# Patient Record
Sex: Male | Born: 1945
Health system: Southern US, Community
[De-identification: ages and names within clinical notes are randomized; demographics above are authoritative.]

---

## 2000-10-08 ENCOUNTER — Emergency Department (HOSPITAL_COMMUNITY): Admission: EM | Admit: 2000-10-08 | Discharge: 2000-10-08 | Payer: Self-pay | Admitting: Emergency Medicine

## 2000-10-08 ENCOUNTER — Encounter: Payer: Self-pay | Admitting: Emergency Medicine

## 2006-09-19 ENCOUNTER — Ambulatory Visit (HOSPITAL_COMMUNITY): Admission: RE | Admit: 2006-09-19 | Discharge: 2006-09-19 | Payer: Self-pay | Admitting: Cardiology

## 2015-12-02 DIAGNOSIS — S60221A Contusion of right hand, initial encounter: Secondary | ICD-10-CM | POA: Diagnosis not present

## 2015-12-02 DIAGNOSIS — S6991XA Unspecified injury of right wrist, hand and finger(s), initial encounter: Secondary | ICD-10-CM | POA: Diagnosis not present

## 2015-12-02 DIAGNOSIS — M7989 Other specified soft tissue disorders: Secondary | ICD-10-CM | POA: Diagnosis not present

## 2015-12-02 DIAGNOSIS — M791 Myalgia: Secondary | ICD-10-CM | POA: Diagnosis not present

## 2016-05-09 DIAGNOSIS — Z23 Encounter for immunization: Secondary | ICD-10-CM | POA: Diagnosis not present

## 2016-08-28 DIAGNOSIS — Z23 Encounter for immunization: Secondary | ICD-10-CM | POA: Diagnosis not present

## 2017-04-22 DIAGNOSIS — Z23 Encounter for immunization: Secondary | ICD-10-CM | POA: Diagnosis not present

## 2017-08-29 DIAGNOSIS — Z23 Encounter for immunization: Secondary | ICD-10-CM | POA: Diagnosis not present

## 2017-11-05 DIAGNOSIS — L980 Pyogenic granuloma: Secondary | ICD-10-CM | POA: Diagnosis not present

## 2018-02-16 DIAGNOSIS — D225 Melanocytic nevi of trunk: Secondary | ICD-10-CM | POA: Diagnosis not present

## 2018-02-16 DIAGNOSIS — L821 Other seborrheic keratosis: Secondary | ICD-10-CM | POA: Diagnosis not present

## 2018-02-16 DIAGNOSIS — L570 Actinic keratosis: Secondary | ICD-10-CM | POA: Diagnosis not present

## 2018-02-16 DIAGNOSIS — D1801 Hemangioma of skin and subcutaneous tissue: Secondary | ICD-10-CM | POA: Diagnosis not present

## 2018-02-16 DIAGNOSIS — L281 Prurigo nodularis: Secondary | ICD-10-CM | POA: Diagnosis not present

## 2018-03-18 DIAGNOSIS — D492 Neoplasm of unspecified behavior of bone, soft tissue, and skin: Secondary | ICD-10-CM | POA: Diagnosis not present

## 2018-03-27 DIAGNOSIS — L929 Granulomatous disorder of the skin and subcutaneous tissue, unspecified: Secondary | ICD-10-CM | POA: Diagnosis not present

## 2018-04-01 DIAGNOSIS — L980 Pyogenic granuloma: Secondary | ICD-10-CM | POA: Diagnosis not present

## 2018-04-01 DIAGNOSIS — L928 Other granulomatous disorders of the skin and subcutaneous tissue: Secondary | ICD-10-CM | POA: Diagnosis not present

## 2018-04-01 DIAGNOSIS — D2372 Other benign neoplasm of skin of left lower limb, including hip: Secondary | ICD-10-CM | POA: Diagnosis not present

## 2018-04-01 DIAGNOSIS — L929 Granulomatous disorder of the skin and subcutaneous tissue, unspecified: Secondary | ICD-10-CM | POA: Diagnosis not present

## 2018-04-15 DIAGNOSIS — S91102D Unspecified open wound of left great toe without damage to nail, subsequent encounter: Secondary | ICD-10-CM | POA: Diagnosis not present

## 2018-05-06 DIAGNOSIS — Z23 Encounter for immunization: Secondary | ICD-10-CM | POA: Diagnosis not present

## 2018-05-06 DIAGNOSIS — L929 Granulomatous disorder of the skin and subcutaneous tissue, unspecified: Secondary | ICD-10-CM | POA: Diagnosis not present

## 2018-07-16 DIAGNOSIS — J9588 Other intraoperative complications of respiratory system, not elsewhere classified: Secondary | ICD-10-CM | POA: Diagnosis not present

## 2018-07-16 DIAGNOSIS — R042 Hemoptysis: Secondary | ICD-10-CM | POA: Diagnosis not present

## 2018-07-16 DIAGNOSIS — I10 Essential (primary) hypertension: Secondary | ICD-10-CM | POA: Diagnosis not present

## 2018-07-16 DIAGNOSIS — K219 Gastro-esophageal reflux disease without esophagitis: Secondary | ICD-10-CM | POA: Diagnosis not present

## 2018-07-16 DIAGNOSIS — R079 Chest pain, unspecified: Secondary | ICD-10-CM | POA: Diagnosis not present

## 2018-07-16 DIAGNOSIS — K7689 Other specified diseases of liver: Secondary | ICD-10-CM | POA: Diagnosis not present

## 2018-07-16 DIAGNOSIS — Z79899 Other long term (current) drug therapy: Secondary | ICD-10-CM | POA: Diagnosis not present

## 2018-07-16 DIAGNOSIS — R918 Other nonspecific abnormal finding of lung field: Secondary | ICD-10-CM | POA: Diagnosis not present

## 2018-07-17 DIAGNOSIS — R042 Hemoptysis: Secondary | ICD-10-CM | POA: Diagnosis not present

## 2019-05-26 DIAGNOSIS — Z23 Encounter for immunization: Secondary | ICD-10-CM | POA: Diagnosis not present

## 2019-11-19 ENCOUNTER — Ambulatory Visit: Payer: Medicare Other | Attending: Internal Medicine

## 2019-11-19 DIAGNOSIS — Z23 Encounter for immunization: Secondary | ICD-10-CM

## 2019-11-19 NOTE — Progress Notes (Signed)
   Covid-19 Vaccination Clinic  Name:  Jeffery Duran    MRN: [id number] DOB: 1945-12-05  11/19/2019  [name] was observed post Covid-19 immunization for 15 minutes without incident. He was provided with Vaccine Information Sheet and instruction to access the V-Safe system.   [name] was instructed to call 911 with any severe reactions post vaccine: Marland Kitchen Difficulty breathing  . Swelling of face and throat  . A fast heartbeat  . A bad rash all over body  . Dizziness and weakness   Immunizations Administered    Name Date Dose VIS Date Route   Pfizer COVID-19 Vaccine 11/19/2019  2:43 PM 0.3 mL 08/20/2019 Intramuscular   Manufacturer: [place]   Lot: [id number]   [place]: [id number]

## 2019-12-14 ENCOUNTER — Ambulatory Visit: Payer: Medicare Other | Attending: Internal Medicine

## 2019-12-14 DIAGNOSIS — Z23 Encounter for immunization: Secondary | ICD-10-CM

## 2019-12-14 NOTE — Progress Notes (Signed)
   Covid-19 Vaccination Clinic  Name:  Jeffery Duran    MRN: [id number] DOB: [birth year]  12/14/2019  Jeffery Duran was observed post Covid-19 immunization for 15 minutes without incident. He was provided with Vaccine Information Sheet and instruction to access the V-Safe system.   Jeffery Duran was instructed to call 911 with any severe reactions post vaccine: Marland Kitchen Difficulty breathing  . Swelling of face and throat  . A fast heartbeat  . A bad rash all over body  . Dizziness and weakness   Immunizations Administered    Name Date Dose VIS Date Route   Pfizer COVID-19 Vaccine 12/14/2019  1:45 PM 0.3 mL 08/20/2019 Intramuscular   Manufacturer: [place]   Lot: [id number]   [place]: [id number]

## 2020-06-10 ENCOUNTER — Encounter: Payer: Self-pay | Admitting: Emergency Medicine

## 2020-06-10 ENCOUNTER — Emergency Department (INDEPENDENT_AMBULATORY_CARE_PROVIDER_SITE_OTHER): Payer: Medicare Other

## 2020-06-10 ENCOUNTER — Other Ambulatory Visit: Payer: Self-pay

## 2020-06-10 ENCOUNTER — Emergency Department (INDEPENDENT_AMBULATORY_CARE_PROVIDER_SITE_OTHER)
Admission: EM | Admit: 2020-06-10 | Discharge: 2020-06-10 | Disposition: A | Discharge status: Home / Self Care | Payer: Medicare Other | Source: Home / Self Care

## 2020-06-10 DIAGNOSIS — Z6829 Body mass index (BMI) 29.0-29.9, adult: Secondary | ICD-10-CM | POA: Diagnosis not present

## 2020-06-10 DIAGNOSIS — E871 Hypo-osmolality and hyponatremia: Secondary | ICD-10-CM | POA: Diagnosis present

## 2020-06-10 DIAGNOSIS — I519 Heart disease, unspecified: Secondary | ICD-10-CM | POA: Diagnosis not present

## 2020-06-10 DIAGNOSIS — R5383 Other fatigue: Secondary | ICD-10-CM | POA: Diagnosis not present

## 2020-06-10 DIAGNOSIS — R0902 Hypoxemia: Secondary | ICD-10-CM | POA: Diagnosis not present

## 2020-06-10 DIAGNOSIS — R590 Localized enlarged lymph nodes: Secondary | ICD-10-CM | POA: Diagnosis not present

## 2020-06-10 DIAGNOSIS — R918 Other nonspecific abnormal finding of lung field: Secondary | ICD-10-CM | POA: Diagnosis not present

## 2020-06-10 DIAGNOSIS — J9601 Acute respiratory failure with hypoxia: Secondary | ICD-10-CM | POA: Diagnosis not present

## 2020-06-10 DIAGNOSIS — I10 Essential (primary) hypertension: Secondary | ICD-10-CM | POA: Diagnosis present

## 2020-06-10 DIAGNOSIS — J189 Pneumonia, unspecified organism: Secondary | ICD-10-CM

## 2020-06-10 DIAGNOSIS — D72819 Decreased white blood cell count, unspecified: Secondary | ICD-10-CM | POA: Diagnosis not present

## 2020-06-10 DIAGNOSIS — J90 Pleural effusion, not elsewhere classified: Secondary | ICD-10-CM | POA: Diagnosis not present

## 2020-06-10 DIAGNOSIS — K219 Gastro-esophageal reflux disease without esophagitis: Secondary | ICD-10-CM | POA: Diagnosis present

## 2020-06-10 DIAGNOSIS — J168 Pneumonia due to other specified infectious organisms: Secondary | ICD-10-CM | POA: Diagnosis not present

## 2020-06-10 DIAGNOSIS — R9431 Abnormal electrocardiogram [ECG] [EKG]: Secondary | ICD-10-CM | POA: Diagnosis not present

## 2020-06-10 DIAGNOSIS — D649 Anemia, unspecified: Secondary | ICD-10-CM | POA: Diagnosis not present

## 2020-06-10 DIAGNOSIS — R509 Fever, unspecified: Secondary | ICD-10-CM | POA: Diagnosis not present

## 2020-06-10 DIAGNOSIS — C880 Waldenstrom macroglobulinemia: Secondary | ICD-10-CM | POA: Diagnosis not present

## 2020-06-10 DIAGNOSIS — R0600 Dyspnea, unspecified: Secondary | ICD-10-CM | POA: Diagnosis not present

## 2020-06-10 DIAGNOSIS — M6281 Muscle weakness (generalized): Secondary | ICD-10-CM | POA: Diagnosis present

## 2020-06-10 DIAGNOSIS — E860 Dehydration: Secondary | ICD-10-CM | POA: Diagnosis present

## 2020-06-10 DIAGNOSIS — E440 Moderate protein-calorie malnutrition: Secondary | ICD-10-CM | POA: Diagnosis present

## 2020-06-10 DIAGNOSIS — R197 Diarrhea, unspecified: Secondary | ICD-10-CM | POA: Diagnosis not present

## 2020-06-10 DIAGNOSIS — D696 Thrombocytopenia, unspecified: Secondary | ICD-10-CM | POA: Diagnosis not present

## 2020-06-10 DIAGNOSIS — Z79899 Other long term (current) drug therapy: Secondary | ICD-10-CM | POA: Diagnosis not present

## 2020-06-10 DIAGNOSIS — Z20822 Contact with and (suspected) exposure to covid-19: Secondary | ICD-10-CM | POA: Diagnosis not present

## 2020-06-10 DIAGNOSIS — R0602 Shortness of breath: Secondary | ICD-10-CM | POA: Diagnosis not present

## 2020-06-10 DIAGNOSIS — R059 Cough, unspecified: Secondary | ICD-10-CM | POA: Diagnosis not present

## 2020-06-10 DIAGNOSIS — R0682 Tachypnea, not elsewhere classified: Secondary | ICD-10-CM | POA: Diagnosis not present

## 2020-06-10 NOTE — ED Notes (Signed)
Patient is being discharged from the Urgent Care and sent to the Emergency Department via POV . Per Leeroy Cha, PA  patient is in need of higher level of care due to SOB, VS & pneumonia. Patient is aware and verbalizes understanding of plan of care. Pt to go straight to ED- discharged in w/c Vitals:   06/10/20 1423  BP: 139/85  Pulse: (!) 103  Temp: (!) 100.7 F (38.2 C)  SpO2: 91%

## 2020-06-10 NOTE — ED Provider Notes (Signed)
[facility]    CSN: [id number] Arrival date & time: 06/10/20  1410      History   Chief Complaint Chief Complaint  Patient presents with  . Fatigue    HPI Jeffery Duran is a 74 y.o. male.   HPI Jeffery Duran is a 74 y.o. male presenting to UC with c/o 2-3 weeks of gradually worsening fatigue, mild SOB, and "not feeling well."  Mildly productive cough.  He has been fully vaccinated for COVID but also reports getting the booster shot a few days prior to current symptoms.  No known fever or chills but has felt warm at home, temp of 100.7*F in triage. No medication taken PTA. Denies n/v/d.  No known sick contacts.  O2 Sat 91% in triage. No hx of COPD or asthma but reports hx of "a blood disorder," Waldenstrom macroglobulinemia.  He was last treated for this with chemo in 2019.    History reviewed. No pertinent past medical history.  There are no problems to display for this patient.   History reviewed. No pertinent surgical history.     Home Medications    Prior to Admission medications   Medication Sig Start Date End Date Taking? Authorizing Provider  amLODipine (NORVASC) 10 MG tablet Take 10 mg by mouth daily. 03/04/20  Yes [provider]  ferrous sulfate 325 (65 FE) MG tablet Take by mouth.   Yes [provider]  omeprazole (PRILOSEC) 20 MG capsule Take by mouth.   Yes [provider]    Family History No family history on file.  Social History Social History   Tobacco Use  . Smoking status: Never Smoker  . Smokeless tobacco: Never Used  Substance Use Topics  . Alcohol use: Not on file  . Drug use: Not on file     Allergies   Patient has no known allergies.   Review of Systems Review of Systems  Constitutional: Positive for fatigue. Negative for chills and fever.  HENT: Positive for congestion. Negative for ear pain, sore throat, trouble swallowing and voice change.   Respiratory: Positive for cough and shortness  of breath.   Cardiovascular: Negative for chest pain and palpitations.  Gastrointestinal: Negative for abdominal pain, diarrhea, nausea and vomiting.  Musculoskeletal: Negative for arthralgias, back pain and myalgias.  Skin: Negative for rash.  Neurological: Positive for weakness ( generalized). Negative for dizziness, light-headedness and headaches.  All other systems reviewed and are negative.    Physical Exam Triage Vital Signs ED Triage Vitals [06/10/20 1423]  Enc Vitals Group     BP 139/85     Pulse Rate (!) 103     Resp      Temp (!) 100.7 F (38.2 C)     Temp Source Oral     SpO2 91 %     Weight      Height      Head Circumference      Peak Flow      Pain Score 0     Pain Loc      Pain Edu?      Excl. in [place]?    No data found.  Updated Vital Signs BP 139/85 (BP Location: Right Arm)   Pulse (!) 103   Temp (!) 100.7 F (38.2 C) (Oral)   SpO2 91%   Visual Acuity Right Eye Distance:   Left Eye Distance:   Bilateral Distance:    Right Eye Near:   Left Eye Near:    Bilateral  Near:     Physical Exam Vitals and nursing note reviewed.  Constitutional:      Appearance: Normal appearance. He is well-developed.     Comments: Elderly male sitting in wheelchair, appears mildly fatigued but is alert and cooperative during exam. NAD  HENT:     Head: Normocephalic and atraumatic.     Right Ear: Tympanic membrane and ear canal normal.     Left Ear: Tympanic membrane and ear canal normal.     Nose: Nose normal.     Right Sinus: No maxillary sinus tenderness or frontal sinus tenderness.     Left Sinus: No maxillary sinus tenderness or frontal sinus tenderness.     Mouth/Throat:     Lips: Pink.     Mouth: Mucous membranes are moist.     Pharynx: Oropharynx is clear. Uvula midline.  Cardiovascular:     Rate and Rhythm: Normal rate and regular rhythm.  Pulmonary:     Effort: Pulmonary effort is normal. No respiratory distress.     Breath sounds: Normal breath  sounds. No stridor. No wheezing, rhonchi or rales.  Musculoskeletal:        General: Normal range of motion.     Cervical back: Normal range of motion and neck supple. No tenderness.  Lymphadenopathy:     Cervical: No cervical adenopathy.  Skin:    General: Skin is warm and dry.  Neurological:     Mental Status: He is alert and oriented to person, place, and time.  Psychiatric:        Behavior: Behavior normal.      UC Treatments / Results  Labs (all labs ordered are listed, but only abnormal results are displayed) Labs Reviewed - No data to display  EKG Date/Time: 06/10/2020   14:34:19 Ventricular Rate: 97 PR Interval: 160 QRS Duration: 96 QT Interval: 350 QTC Calculation: 444 P-R-T axes: 45  -43   72 Text Interpretation: Normal sinus rhythm, possible Left atrial enlargement. Left axis deviation. Pulmonary disease pattern. Incomplete right bundle branch block. Nonspecific ST and T wave abnormality. Abnormal ECG  No significant change since prior EKG in 2019  Radiology DG Chest 2 View  Result Date: 06/10/2020 CLINICAL DATA:  Shortness of breath, fever and productive cough. The patient has felt ill for 2-3 weeks. EXAM: CHEST - 2 VIEW COMPARISON:  None. FINDINGS: Patchy airspace disease in the lingula and right middle lobe is worse on the right middle lobe. Heart size is normal. No pneumothorax or pleural effusion. IMPRESSION: Findings most consistent with multifocal pneumonia. Electronically Signed   By: Inge Rise M.D.   On: 06/10/2020 14:48    Procedures Procedures (including critical care time)  Medications Ordered in UC Medications - No data to display  Initial Impression / Assessment and Plan / UC Course  I have reviewed the triage vital signs and the nursing notes.  Pertinent labs & imaging results that were available during my care of the patient were reviewed by me and considered in my medical decision making (see chart for details).      Discussed imaging with pt Due to diffuse nature of pneumonia, worsening symptoms, O2 sat of 91% on RA and patient's age, recommend further evaluation and treatment in emergency department this afternoon. Pt declined EMS transport but understanding and agreeable with tx plan to go to the hospital. AVS given  Final Clinical Impressions(s) / UC Diagnoses   Final diagnoses:  Multifocal pneumonia     Discharge Instructions  Due to the severity of your pneumonia and your worsening shortness of breath, it is advised you are evaluated further and treated in the emergency department. IV antibiotics may be indicated and they may need to admit you.  You have declined EMS transport, but please still drive directly to the hospital this afternoon.     ED Prescriptions    None     PDMP not reviewed this encounter.   Noe Gens, [place] 06/10/20 [phone]

## 2020-06-10 NOTE — Discharge Instructions (Signed)
  Due to the severity of your pneumonia and your worsening shortness of breath, it is advised you are evaluated further and treated in the emergency department. IV antibiotics may be indicated and they may need to admit you.  You have declined EMS transport, but please still drive directly to the hospital this afternoon.

## 2020-06-10 NOTE — ED Triage Notes (Signed)
Patient states that he is feeling weak, SOB, fever, productive cough, feeling bad x 2-3 weeks.  Patient is vaccinated.  No OTC meds.

## 2020-06-11 DIAGNOSIS — J189 Pneumonia, unspecified organism: Secondary | ICD-10-CM | POA: Diagnosis not present

## 2020-06-11 DIAGNOSIS — J9601 Acute respiratory failure with hypoxia: Secondary | ICD-10-CM | POA: Diagnosis not present

## 2020-06-11 DIAGNOSIS — I10 Essential (primary) hypertension: Secondary | ICD-10-CM | POA: Diagnosis not present

## 2020-06-12 DIAGNOSIS — J189 Pneumonia, unspecified organism: Secondary | ICD-10-CM | POA: Diagnosis not present

## 2020-06-12 DIAGNOSIS — I10 Essential (primary) hypertension: Secondary | ICD-10-CM | POA: Diagnosis not present

## 2020-06-12 DIAGNOSIS — J9601 Acute respiratory failure with hypoxia: Secondary | ICD-10-CM | POA: Diagnosis not present

## 2020-06-13 DIAGNOSIS — R0600 Dyspnea, unspecified: Secondary | ICD-10-CM | POA: Diagnosis not present

## 2020-06-13 DIAGNOSIS — R918 Other nonspecific abnormal finding of lung field: Secondary | ICD-10-CM | POA: Diagnosis not present

## 2020-06-13 DIAGNOSIS — J189 Pneumonia, unspecified organism: Secondary | ICD-10-CM | POA: Diagnosis not present

## 2020-06-13 DIAGNOSIS — I10 Essential (primary) hypertension: Secondary | ICD-10-CM | POA: Diagnosis not present

## 2020-06-13 DIAGNOSIS — J9601 Acute respiratory failure with hypoxia: Secondary | ICD-10-CM | POA: Diagnosis not present

## 2020-06-13 DIAGNOSIS — R0602 Shortness of breath: Secondary | ICD-10-CM | POA: Diagnosis not present

## 2020-06-14 DIAGNOSIS — J9601 Acute respiratory failure with hypoxia: Secondary | ICD-10-CM | POA: Diagnosis not present

## 2020-06-14 DIAGNOSIS — J189 Pneumonia, unspecified organism: Secondary | ICD-10-CM | POA: Diagnosis not present

## 2020-06-14 DIAGNOSIS — I10 Essential (primary) hypertension: Secondary | ICD-10-CM | POA: Diagnosis not present

## 2020-06-15 DIAGNOSIS — J9601 Acute respiratory failure with hypoxia: Secondary | ICD-10-CM | POA: Diagnosis not present

## 2020-06-15 DIAGNOSIS — D72819 Decreased white blood cell count, unspecified: Secondary | ICD-10-CM | POA: Diagnosis not present

## 2020-06-15 DIAGNOSIS — E871 Hypo-osmolality and hyponatremia: Secondary | ICD-10-CM | POA: Diagnosis not present

## 2020-06-15 DIAGNOSIS — D649 Anemia, unspecified: Secondary | ICD-10-CM | POA: Diagnosis not present

## 2020-06-15 DIAGNOSIS — J189 Pneumonia, unspecified organism: Secondary | ICD-10-CM | POA: Diagnosis not present

## 2020-06-15 DIAGNOSIS — I10 Essential (primary) hypertension: Secondary | ICD-10-CM | POA: Diagnosis not present

## 2020-06-15 DIAGNOSIS — D696 Thrombocytopenia, unspecified: Secondary | ICD-10-CM | POA: Diagnosis not present

## 2020-06-16 DIAGNOSIS — J189 Pneumonia, unspecified organism: Secondary | ICD-10-CM | POA: Diagnosis not present

## 2020-06-16 DIAGNOSIS — E871 Hypo-osmolality and hyponatremia: Secondary | ICD-10-CM | POA: Diagnosis not present

## 2020-06-16 DIAGNOSIS — D72819 Decreased white blood cell count, unspecified: Secondary | ICD-10-CM | POA: Diagnosis not present

## 2020-06-16 DIAGNOSIS — I10 Essential (primary) hypertension: Secondary | ICD-10-CM | POA: Diagnosis not present

## 2020-06-16 DIAGNOSIS — D696 Thrombocytopenia, unspecified: Secondary | ICD-10-CM | POA: Diagnosis not present

## 2020-06-16 DIAGNOSIS — D649 Anemia, unspecified: Secondary | ICD-10-CM | POA: Diagnosis not present

## 2020-06-16 DIAGNOSIS — J9601 Acute respiratory failure with hypoxia: Secondary | ICD-10-CM | POA: Diagnosis not present

## 2020-06-17 DIAGNOSIS — J189 Pneumonia, unspecified organism: Secondary | ICD-10-CM | POA: Diagnosis not present

## 2020-06-17 DIAGNOSIS — E871 Hypo-osmolality and hyponatremia: Secondary | ICD-10-CM | POA: Diagnosis not present

## 2020-06-17 DIAGNOSIS — D72819 Decreased white blood cell count, unspecified: Secondary | ICD-10-CM | POA: Diagnosis not present

## 2020-06-17 DIAGNOSIS — D696 Thrombocytopenia, unspecified: Secondary | ICD-10-CM | POA: Diagnosis not present

## 2020-06-17 DIAGNOSIS — I10 Essential (primary) hypertension: Secondary | ICD-10-CM | POA: Diagnosis not present

## 2020-06-17 DIAGNOSIS — D649 Anemia, unspecified: Secondary | ICD-10-CM | POA: Diagnosis not present

## 2020-06-17 DIAGNOSIS — J9601 Acute respiratory failure with hypoxia: Secondary | ICD-10-CM | POA: Diagnosis not present

## 2020-06-19 DIAGNOSIS — E440 Moderate protein-calorie malnutrition: Secondary | ICD-10-CM | POA: Diagnosis not present

## 2020-06-21 DIAGNOSIS — J90 Pleural effusion, not elsewhere classified: Secondary | ICD-10-CM | POA: Diagnosis not present

## 2020-06-21 DIAGNOSIS — R7989 Other specified abnormal findings of blood chemistry: Secondary | ICD-10-CM | POA: Diagnosis not present

## 2020-06-21 DIAGNOSIS — Z20822 Contact with and (suspected) exposure to covid-19: Secondary | ICD-10-CM | POA: Diagnosis not present

## 2020-06-21 DIAGNOSIS — C880 Waldenstrom macroglobulinemia: Secondary | ICD-10-CM | POA: Diagnosis present

## 2020-06-21 DIAGNOSIS — E875 Hyperkalemia: Secondary | ICD-10-CM | POA: Diagnosis not present

## 2020-06-21 DIAGNOSIS — I700 Atherosclerosis of aorta: Secondary | ICD-10-CM | POA: Diagnosis not present

## 2020-06-21 DIAGNOSIS — K7290 Hepatic failure, unspecified without coma: Secondary | ICD-10-CM | POA: Diagnosis not present

## 2020-06-21 DIAGNOSIS — Z4682 Encounter for fitting and adjustment of non-vascular catheter: Secondary | ICD-10-CM | POA: Diagnosis not present

## 2020-06-21 DIAGNOSIS — K4020 Bilateral inguinal hernia, without obstruction or gangrene, not specified as recurrent: Secondary | ICD-10-CM | POA: Diagnosis not present

## 2020-06-21 DIAGNOSIS — R000 Tachycardia, unspecified: Secondary | ICD-10-CM | POA: Diagnosis not present

## 2020-06-21 DIAGNOSIS — R410 Disorientation, unspecified: Secondary | ICD-10-CM | POA: Diagnosis not present

## 2020-06-21 DIAGNOSIS — E861 Hypovolemia: Secondary | ICD-10-CM | POA: Diagnosis present

## 2020-06-21 DIAGNOSIS — I313 Pericardial effusion (noninflammatory): Secondary | ICD-10-CM | POA: Diagnosis not present

## 2020-06-21 DIAGNOSIS — R6521 Severe sepsis with septic shock: Secondary | ICD-10-CM | POA: Diagnosis not present

## 2020-06-21 DIAGNOSIS — R0689 Other abnormalities of breathing: Secondary | ICD-10-CM | POA: Diagnosis not present

## 2020-06-21 DIAGNOSIS — T451X5A Adverse effect of antineoplastic and immunosuppressive drugs, initial encounter: Secondary | ICD-10-CM | POA: Diagnosis present

## 2020-06-21 DIAGNOSIS — I248 Other forms of acute ischemic heart disease: Secondary | ICD-10-CM | POA: Diagnosis present

## 2020-06-21 DIAGNOSIS — E872 Acidosis: Secondary | ICD-10-CM | POA: Diagnosis not present

## 2020-06-21 DIAGNOSIS — R0902 Hypoxemia: Secondary | ICD-10-CM | POA: Diagnosis not present

## 2020-06-21 DIAGNOSIS — R918 Other nonspecific abnormal finding of lung field: Secondary | ICD-10-CM | POA: Diagnosis not present

## 2020-06-21 DIAGNOSIS — J918 Pleural effusion in other conditions classified elsewhere: Secondary | ICD-10-CM | POA: Diagnosis not present

## 2020-06-21 DIAGNOSIS — A419 Sepsis, unspecified organism: Secondary | ICD-10-CM | POA: Diagnosis not present

## 2020-06-21 DIAGNOSIS — R0602 Shortness of breath: Secondary | ICD-10-CM | POA: Diagnosis not present

## 2020-06-21 DIAGNOSIS — R590 Localized enlarged lymph nodes: Secondary | ICD-10-CM | POA: Diagnosis not present

## 2020-06-21 DIAGNOSIS — Z66 Do not resuscitate: Secondary | ICD-10-CM | POA: Diagnosis not present

## 2020-06-21 DIAGNOSIS — I10 Essential (primary) hypertension: Secondary | ICD-10-CM | POA: Diagnosis present

## 2020-06-21 DIAGNOSIS — K7200 Acute and subacute hepatic failure without coma: Secondary | ICD-10-CM | POA: Diagnosis not present

## 2020-06-21 DIAGNOSIS — I471 Supraventricular tachycardia: Secondary | ICD-10-CM | POA: Diagnosis not present

## 2020-06-21 DIAGNOSIS — J811 Chronic pulmonary edema: Secondary | ICD-10-CM | POA: Diagnosis not present

## 2020-06-21 DIAGNOSIS — J189 Pneumonia, unspecified organism: Secondary | ICD-10-CM | POA: Diagnosis present

## 2020-06-21 DIAGNOSIS — Z87891 Personal history of nicotine dependence: Secondary | ICD-10-CM | POA: Diagnosis not present

## 2020-06-21 DIAGNOSIS — R6520 Severe sepsis without septic shock: Secondary | ICD-10-CM | POA: Diagnosis not present

## 2020-06-21 DIAGNOSIS — G9340 Encephalopathy, unspecified: Secondary | ICD-10-CM | POA: Diagnosis not present

## 2020-06-21 DIAGNOSIS — N179 Acute kidney failure, unspecified: Secondary | ICD-10-CM | POA: Diagnosis present

## 2020-06-21 DIAGNOSIS — J9601 Acute respiratory failure with hypoxia: Secondary | ICD-10-CM | POA: Diagnosis present

## 2020-06-21 DIAGNOSIS — D84821 Immunodeficiency due to drugs: Secondary | ICD-10-CM | POA: Diagnosis not present

## 2020-06-21 DIAGNOSIS — M160 Bilateral primary osteoarthritis of hip: Secondary | ICD-10-CM | POA: Diagnosis not present

## 2020-06-22 DIAGNOSIS — C880 Waldenstrom macroglobulinemia: Secondary | ICD-10-CM | POA: Diagnosis not present

## 2020-06-22 DIAGNOSIS — J9601 Acute respiratory failure with hypoxia: Secondary | ICD-10-CM | POA: Diagnosis not present

## 2020-06-22 DIAGNOSIS — J189 Pneumonia, unspecified organism: Secondary | ICD-10-CM | POA: Diagnosis not present

## 2020-06-22 DIAGNOSIS — A419 Sepsis, unspecified organism: Secondary | ICD-10-CM | POA: Diagnosis not present

## 2020-06-22 DIAGNOSIS — R6520 Severe sepsis without septic shock: Secondary | ICD-10-CM | POA: Diagnosis not present

## 2020-06-22 DIAGNOSIS — R590 Localized enlarged lymph nodes: Secondary | ICD-10-CM | POA: Diagnosis not present

## 2020-06-22 DIAGNOSIS — J918 Pleural effusion in other conditions classified elsewhere: Secondary | ICD-10-CM | POA: Diagnosis not present

## 2020-07-10 DEATH — deceased

## 2022-07-07 IMAGING — DX DG CHEST 2V
2 series · 2 of 2 positions shown · non-contrast
Comparison: None.

CLINICAL DATA: Shortness of breath, fever and productive cough. The
patient has felt ill for 2-3 weeks.

EXAM:
CHEST - 2 VIEW

[chest pa]
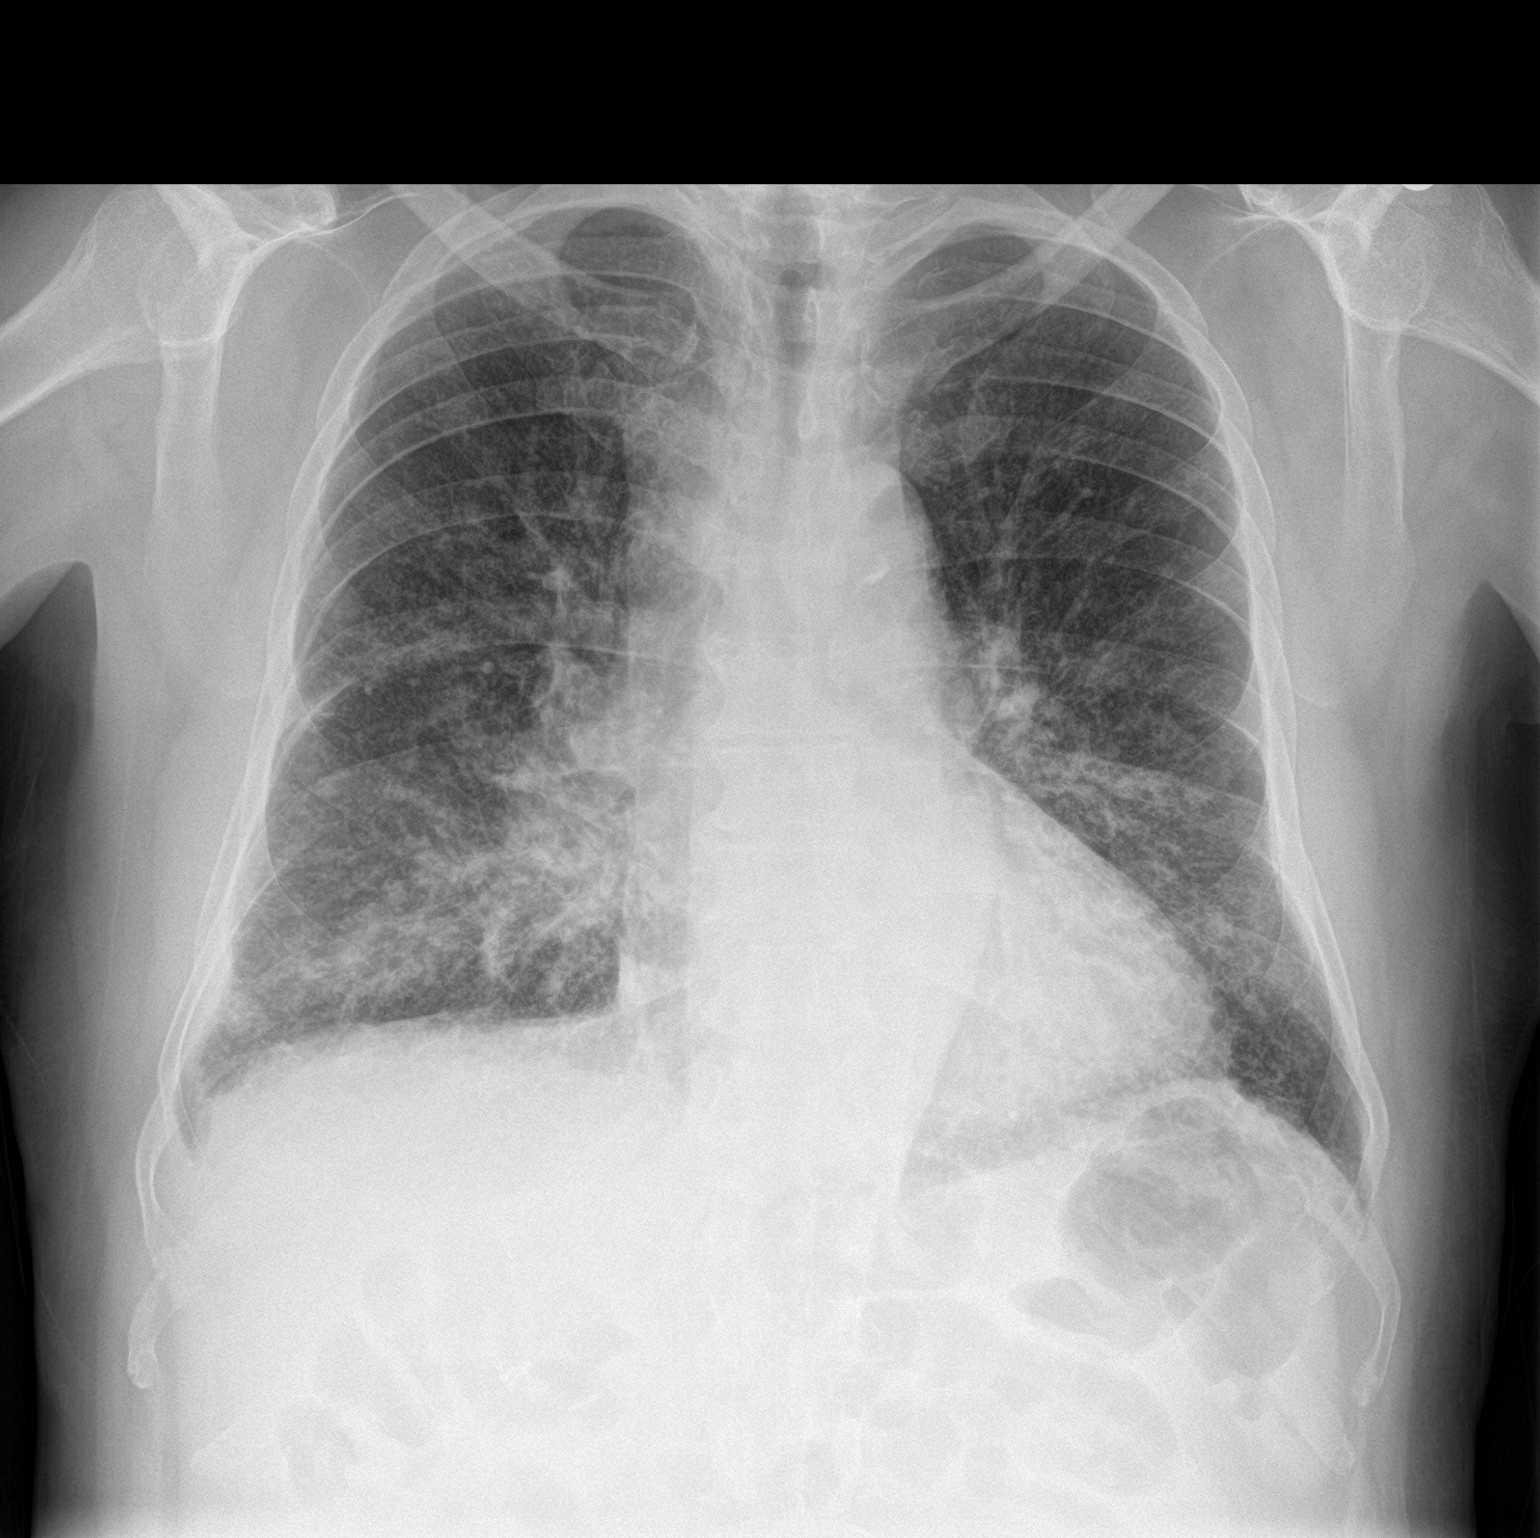

[chest lat]
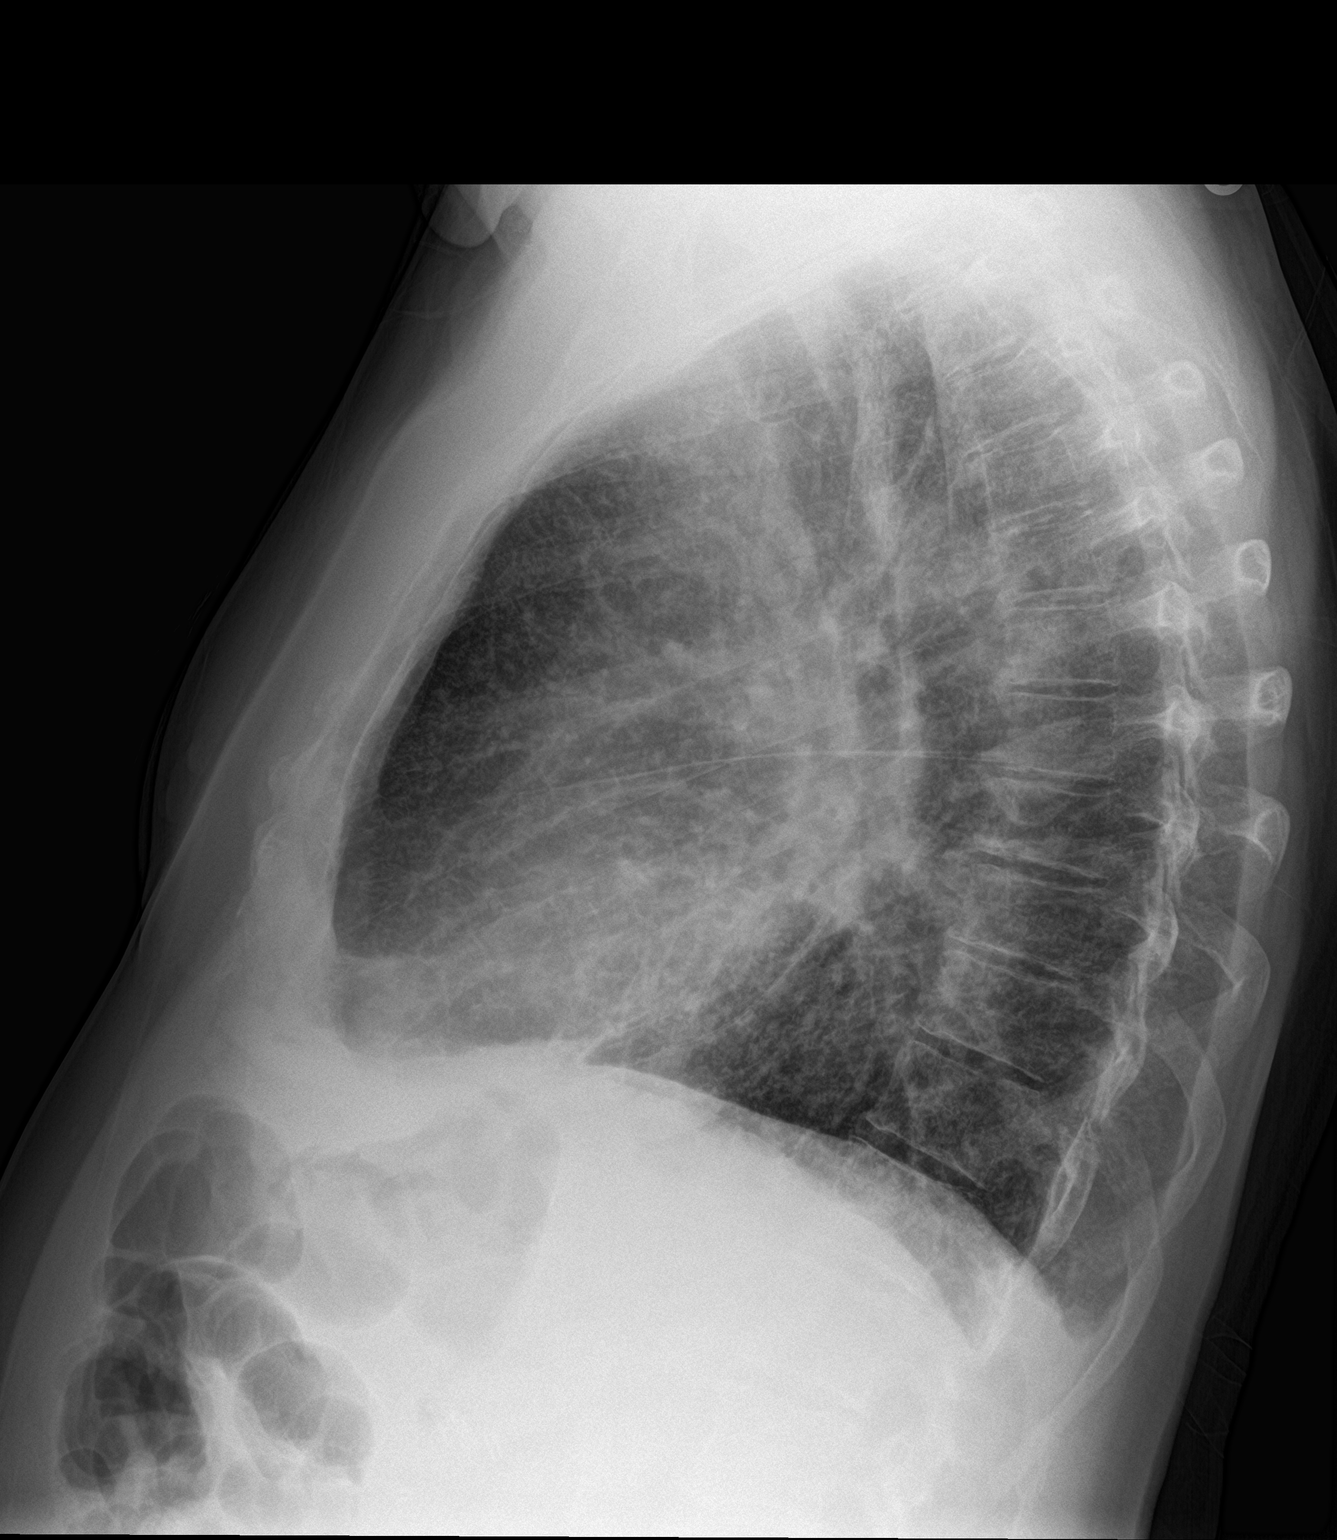

[2 of 2 positions shown; findings below may reference images not displayed]

FINDINGS: Patchy airspace disease in the lingula and right middle lobe is
worse on the right middle lobe. Heart size is normal. No
pneumothorax or pleural effusion.
IMPRESSION: Findings most consistent with multifocal pneumonia.
